# Patient Record
Sex: Female | Born: 1960 | Race: White | Hispanic: No | Marital: Married | Smoking: Never smoker
Health system: Southern US, Community
[De-identification: ages and names within clinical notes are randomized; demographics above are authoritative.]

## PROBLEM LIST (undated history)

## (undated) DIAGNOSIS — G039 Meningitis, unspecified: Secondary | ICD-10-CM

## (undated) DIAGNOSIS — M8580 Other specified disorders of bone density and structure, unspecified site: Secondary | ICD-10-CM

## (undated) DIAGNOSIS — N9489 Other specified conditions associated with female genital organs and menstrual cycle: Secondary | ICD-10-CM

## (undated) DIAGNOSIS — C8510 Unspecified B-cell lymphoma, unspecified site: Secondary | ICD-10-CM

## (undated) DIAGNOSIS — N838 Other noninflammatory disorders of ovary, fallopian tube and broad ligament: Secondary | ICD-10-CM

## (undated) DIAGNOSIS — I341 Nonrheumatic mitral (valve) prolapse: Secondary | ICD-10-CM

## (undated) DIAGNOSIS — Z9221 Personal history of antineoplastic chemotherapy: Secondary | ICD-10-CM

## (undated) DIAGNOSIS — R112 Nausea with vomiting, unspecified: Secondary | ICD-10-CM

## (undated) DIAGNOSIS — R748 Abnormal levels of other serum enzymes: Secondary | ICD-10-CM

## (undated) DIAGNOSIS — G96198 Other disorders of meninges, not elsewhere classified: Secondary | ICD-10-CM

## (undated) DIAGNOSIS — T451X5A Adverse effect of antineoplastic and immunosuppressive drugs, initial encounter: Secondary | ICD-10-CM

## (undated) DIAGNOSIS — C179 Malignant neoplasm of small intestine, unspecified: Secondary | ICD-10-CM

## (undated) DIAGNOSIS — K760 Fatty (change of) liver, not elsewhere classified: Secondary | ICD-10-CM

## (undated) HISTORY — DX: Other disorders of meninges, not elsewhere classified: G96.198

## (undated) HISTORY — DX: Nausea with vomiting, unspecified: R11.2

## (undated) HISTORY — DX: Malignant neoplasm of small intestine, unspecified: C17.9

## (undated) HISTORY — DX: Other specified disorders of bone density and structure, unspecified site: M85.80

## (undated) HISTORY — DX: Fatty (change of) liver, not elsewhere classified: K76.0

## (undated) HISTORY — PX: LAPAROSCOPY: SHX197

## (undated) HISTORY — DX: Other specified conditions associated with female genital organs and menstrual cycle: N94.89

## (undated) HISTORY — PX: DILATION AND CURETTAGE OF UTERUS: SHX78

## (undated) HISTORY — DX: Nonrheumatic mitral (valve) prolapse: I34.1

## (undated) HISTORY — DX: Meningitis, unspecified: G03.9

## (undated) HISTORY — DX: Other noninflammatory disorders of ovary, fallopian tube and broad ligament: N83.8

## (undated) HISTORY — DX: Abnormal levels of other serum enzymes: R74.8

## (undated) HISTORY — DX: Adverse effect of antineoplastic and immunosuppressive drugs, initial encounter: T45.1X5A

## (undated) HISTORY — DX: Personal history of antineoplastic chemotherapy: Z92.21

## (undated) HISTORY — DX: Unspecified B-cell lymphoma, unspecified site: C85.10

---

## 1998-08-21 ENCOUNTER — Other Ambulatory Visit: Admission: RE | Admit: 1998-08-21 | Discharge: 1998-08-21 | Payer: Self-pay | Admitting: Obstetrics and Gynecology

## 2000-04-07 ENCOUNTER — Other Ambulatory Visit: Admission: RE | Admit: 2000-04-07 | Discharge: 2000-04-07 | Payer: Self-pay | Admitting: Obstetrics and Gynecology

## 2001-05-28 ENCOUNTER — Other Ambulatory Visit: Admission: RE | Admit: 2001-05-28 | Discharge: 2001-05-28 | Payer: Self-pay | Admitting: Obstetrics and Gynecology

## 2001-06-10 ENCOUNTER — Encounter: Payer: Self-pay | Admitting: Obstetrics and Gynecology

## 2001-06-10 ENCOUNTER — Ambulatory Visit (HOSPITAL_COMMUNITY): Admission: RE | Admit: 2001-06-10 | Discharge: 2001-06-10 | Payer: Self-pay | Admitting: Obstetrics and Gynecology

## 2002-08-10 ENCOUNTER — Other Ambulatory Visit: Admission: RE | Admit: 2002-08-10 | Discharge: 2002-08-10 | Payer: Self-pay | Admitting: Obstetrics and Gynecology

## 2003-04-05 ENCOUNTER — Ambulatory Visit (HOSPITAL_COMMUNITY): Admission: RE | Admit: 2003-04-05 | Discharge: 2003-04-05 | Payer: Self-pay | Admitting: Obstetrics and Gynecology

## 2003-04-07 ENCOUNTER — Encounter: Admission: RE | Admit: 2003-04-07 | Discharge: 2003-04-07 | Payer: Self-pay | Admitting: Obstetrics and Gynecology

## 2003-11-07 ENCOUNTER — Other Ambulatory Visit: Admission: RE | Admit: 2003-11-07 | Discharge: 2003-11-07 | Payer: Self-pay | Admitting: Obstetrics and Gynecology

## 2003-11-15 ENCOUNTER — Encounter: Admission: RE | Admit: 2003-11-15 | Discharge: 2003-11-15 | Payer: Self-pay | Admitting: Obstetrics and Gynecology

## 2005-01-08 ENCOUNTER — Ambulatory Visit (HOSPITAL_COMMUNITY): Admission: RE | Admit: 2005-01-08 | Discharge: 2005-01-08 | Payer: Self-pay | Admitting: Obstetrics and Gynecology

## 2005-01-21 ENCOUNTER — Other Ambulatory Visit: Admission: RE | Admit: 2005-01-21 | Discharge: 2005-01-21 | Payer: Self-pay | Admitting: Obstetrics and Gynecology

## 2005-02-12 ENCOUNTER — Encounter: Admission: RE | Admit: 2005-02-12 | Discharge: 2005-02-12 | Payer: Self-pay | Admitting: Obstetrics and Gynecology

## 2006-01-28 ENCOUNTER — Encounter: Payer: Self-pay | Admitting: Family Medicine

## 2006-02-13 ENCOUNTER — Ambulatory Visit (HOSPITAL_COMMUNITY): Admission: RE | Admit: 2006-02-13 | Discharge: 2006-02-13 | Payer: Self-pay | Admitting: Obstetrics and Gynecology

## 2006-02-13 ENCOUNTER — Other Ambulatory Visit: Admission: RE | Admit: 2006-02-13 | Discharge: 2006-02-13 | Payer: Self-pay | Admitting: Obstetrics & Gynecology

## 2006-06-18 ENCOUNTER — Ambulatory Visit: Payer: Self-pay | Admitting: Orthopaedic Surgery

## 2006-12-17 ENCOUNTER — Ambulatory Visit: Payer: Self-pay

## 2006-12-30 ENCOUNTER — Other Ambulatory Visit: Admission: RE | Admit: 2006-12-30 | Discharge: 2006-12-30 | Payer: Self-pay | Admitting: Obstetrics and Gynecology

## 2007-06-21 ENCOUNTER — Ambulatory Visit: Payer: Self-pay | Admitting: Family Medicine

## 2007-06-21 DIAGNOSIS — M949 Disorder of cartilage, unspecified: Secondary | ICD-10-CM

## 2007-06-21 DIAGNOSIS — M899 Disorder of bone, unspecified: Secondary | ICD-10-CM | POA: Insufficient documentation

## 2007-06-21 DIAGNOSIS — Z8679 Personal history of other diseases of the circulatory system: Secondary | ICD-10-CM | POA: Insufficient documentation

## 2007-06-21 DIAGNOSIS — E559 Vitamin D deficiency, unspecified: Secondary | ICD-10-CM | POA: Insufficient documentation

## 2007-06-23 ENCOUNTER — Ambulatory Visit: Payer: Self-pay | Admitting: Family Medicine

## 2007-06-23 DIAGNOSIS — R5381 Other malaise: Secondary | ICD-10-CM

## 2007-06-23 DIAGNOSIS — R5383 Other fatigue: Secondary | ICD-10-CM

## 2007-06-25 LAB — CONVERTED CEMR LAB
ALT: 46 units/L — ABNORMAL HIGH (ref 0–35)
AST: 25 units/L (ref 0–37)
Alkaline Phosphatase: 67 units/L (ref 39–117)
Bilirubin, Direct: 0.2 mg/dL (ref 0.0–0.3)
CO2: 27 meq/L (ref 19–32)
Chloride: 105 meq/L (ref 96–112)
GFR calc non Af Amer: 81 mL/min
LDL Cholesterol: 78 mg/dL (ref 0–99)
Potassium: 4.1 meq/L (ref 3.5–5.1)
Sodium: 137 meq/L (ref 135–145)
TSH: 1.13 microintl units/mL (ref 0.35–5.50)
Total Bilirubin: 1.3 mg/dL — ABNORMAL HIGH (ref 0.3–1.2)
Total CHOL/HDL Ratio: 2.1
Triglycerides: 48 mg/dL (ref 0–149)

## 2008-02-01 ENCOUNTER — Other Ambulatory Visit: Admission: RE | Admit: 2008-02-01 | Discharge: 2008-02-01 | Payer: Self-pay | Admitting: Obstetrics and Gynecology

## 2008-06-29 ENCOUNTER — Ambulatory Visit: Payer: Self-pay | Admitting: Family Medicine

## 2008-06-29 DIAGNOSIS — M79609 Pain in unspecified limb: Secondary | ICD-10-CM

## 2008-06-29 DIAGNOSIS — I83893 Varicose veins of bilateral lower extremities with other complications: Secondary | ICD-10-CM | POA: Insufficient documentation

## 2008-08-16 ENCOUNTER — Ambulatory Visit: Payer: Self-pay | Admitting: Vascular Surgery

## 2008-11-22 ENCOUNTER — Ambulatory Visit: Payer: Self-pay | Admitting: Vascular Surgery

## 2009-03-02 ENCOUNTER — Ambulatory Visit (HOSPITAL_COMMUNITY): Admission: RE | Admit: 2009-03-02 | Discharge: 2009-03-02 | Payer: Self-pay | Admitting: Obstetrics and Gynecology

## 2009-03-21 ENCOUNTER — Ambulatory Visit: Payer: Self-pay | Admitting: Vascular Surgery

## 2009-03-28 ENCOUNTER — Ambulatory Visit: Payer: Self-pay | Admitting: Vascular Surgery

## 2009-03-30 ENCOUNTER — Ambulatory Visit: Payer: Self-pay | Admitting: Vascular Surgery

## 2009-04-11 ENCOUNTER — Ambulatory Visit: Payer: Self-pay | Admitting: Vascular Surgery

## 2009-04-18 ENCOUNTER — Ambulatory Visit: Payer: Self-pay | Admitting: Vascular Surgery

## 2009-06-20 ENCOUNTER — Ambulatory Visit: Payer: Self-pay | Admitting: Vascular Surgery

## 2009-06-27 ENCOUNTER — Ambulatory Visit: Payer: Self-pay | Admitting: Vascular Surgery

## 2010-03-06 ENCOUNTER — Telehealth: Payer: Self-pay | Admitting: Internal Medicine

## 2010-03-08 ENCOUNTER — Ambulatory Visit
Admission: RE | Admit: 2010-03-08 | Discharge: 2010-03-08 | Payer: Self-pay | Source: Home / Self Care | Attending: Family Medicine | Admitting: Family Medicine

## 2010-03-08 DIAGNOSIS — R42 Dizziness and giddiness: Secondary | ICD-10-CM | POA: Insufficient documentation

## 2010-03-13 ENCOUNTER — Encounter: Payer: Self-pay | Admitting: Family Medicine

## 2010-03-16 ENCOUNTER — Encounter: Payer: Self-pay | Admitting: Obstetrics and Gynecology

## 2010-03-21 ENCOUNTER — Other Ambulatory Visit: Payer: Self-pay | Admitting: Obstetrics and Gynecology

## 2010-03-21 ENCOUNTER — Ambulatory Visit (HOSPITAL_COMMUNITY): Admission: RE | Admit: 2010-03-21 | Payer: Self-pay | Source: Home / Self Care | Admitting: Obstetrics and Gynecology

## 2010-03-21 DIAGNOSIS — Z139 Encounter for screening, unspecified: Secondary | ICD-10-CM

## 2010-03-21 DIAGNOSIS — Z1231 Encounter for screening mammogram for malignant neoplasm of breast: Secondary | ICD-10-CM

## 2010-03-27 ENCOUNTER — Encounter: Payer: Self-pay | Admitting: Obstetrics and Gynecology

## 2010-03-28 NOTE — Progress Notes (Signed)
Summary: patient canc appt  Phone Note Call from Patient   Caller: Patient Call For: Kerby Nora MD Summary of Call: Patient called earlier, saying that she was feeling dizzy, "eyes were going crazy", and was feeling nauseated. I spoke with Dr. Para March, he suggested UC, then Dr. Alphonsus Sias said that if patient felt comftorable enough to wait until tomorrow that he would see her at 1:30. Patient was okay with that idea because she said that she had already started to feel better, appt was scheduled. Then patient called back this afternoon saying that she was feeling much better now and wanted to just follow up with Dr. Ermalene Searing on Friday because tomorrow is going to be a hectic day for her at work. Appt scheduled with Dr. Ermalene Searing for Friday.  Initial call taken by: Melody Comas,  March 06, 2010 4:48 PM  Follow-up for Phone Call        That is fine Follow-up by: Cindee Salt MD,  March 06, 2010 6:53 PM

## 2010-03-28 NOTE — Assessment & Plan Note (Signed)
Summary: dizzy, nauseated/alc   Vital Signs:  Patient profile:   50 year old female Height:      61.25 inches Weight:      155.50 pounds BMI:     29.25 Temp:     98.3 degrees F oral Pulse rate:   72 / minute Pulse rhythm:   regular BP sitting:   110 / 80  (left arm) Cuff size:   regular  Vitals Entered By: Benny Lennert CMA Duncan Dull) (March 08, 2010 10:50 AM)  History of Present Illness: Chief complaint dizzy,nauseated and ? seizure on wednesday   Was feeling fine doing well until 2 days ago. When sitting at computer..couldn't focus, room spinning fast eyes were moving rapidly (nystagmus).. felt pale and hot. Lasted several minutes. (Was concious the whole time, talking, thinking clearly)  She had been trying out new contacts.. but eye MDs do not think that this is cause.  Since episode...she has been nauseous and intermittant lightheadedness when driving..no real change when moving head.  No vision change. No blurred vision.  No tnnitus, no hearing loss.   No weakness, no numbness. No rhythmic movements  BP at home was nml.  No hisotry of high cholesterol, nonsmoker.No family history of CVA.  Problems Prior to Update: 1)  Leg Pain, Bilateral  (ICD-729.5) 2)  Varicose Veins Lower Extremities W/oth Comps  (ICD-454.8) 3)  Other Malaise and Fatigue  (ICD-780.79) 4)  Screening For Lipoid Disorders  (ICD-V77.91) 5)  Osteopenia  (ICD-733.90) 6)  Unspecified Vitamin D Deficiency  (ICD-268.9) 7)  Family History of Cad Female 1st Degree Relative <50  (ICD-V17.3) 8)  Arrhythmia, Hx of  (ICD-V12.50)  Current Medications (verified): 1)  Multivitamins   Tabs (Multiple Vitamin) .... Take 1 Tablet By Mouth Once A Day  Allergies (verified): No Known Drug Allergies  Past History:  Past medical, surgical, family and social histories (including risk factors) reviewed, and no changes noted (except as noted below).  Past Medical History: Reviewed history from 06/29/2008 and no  changes required. neg  Past Surgical History: Reviewed history from 06/21/2007 and no changes required. stress fracture femoral neck 2/08 Tonsillectomy exp lap 1970s negative  Family History: Reviewed history from 06/21/2007 and no changes required. father: lung cancer, prostate Cancer mother: high cholesterol brother: MI age 43, then 3 since,  Family History of CAD Female 1st degree relative <50 MGM: DM, heart prob  Social History: Reviewed history from 06/21/2007 and no changes required. Occupation: Advertising account executive Married 2 children: healthy Never Smoked Alcohol use-yes, champagne 2-3 on weekends Regular exercise-no, usually does except  Diet: fast food  Review of Systems General:  Denies fatigue and fever. CV:  Denies chest pain or discomfort. Resp:  Denies shortness of breath. GI:  Denies abdominal pain. GU:  Denies dysuria.  Physical Exam  General:  Well-developed,well-nourished,in no acute distress; alert,appropriate and cooperative throughout examination Eyes:  No corneal or conjunctival inflammation noted. EOMI. Perrla. Funduscopic exam benign, without hemorrhages, exudates or papilledema. Vision grossly normal. Ears:  External ear exam shows no significant lesions or deformities.  Otoscopic examination reveals clear canals, tympanic membranes are intact bilaterally without bulging, retraction, inflammation or discharge. Hearing is grossly normal bilaterally. Nose:  External nasal examination shows no deformity or inflammation. Nasal mucosa are pink and moist without lesions or exudates. Mouth:  Oral mucosa and oropharynx without lesions or exudates.  Teeth in good repair. Neck:  no carotid bruit or thyromegaly no cervical or supraclavicular lymphadenopathy  Lungs:  Normal respiratory effort, chest  expands symmetrically. Lungs are clear to auscultation, no crackles or wheezes. Heart:  Normal rate and regular rhythm. S1 and S2 normal without gallop, murmur, click, rub or  other extra sounds. Pulses:  Diffuse varicose veins in LE, DP and PT 2+ Extremities:  No clubbing, cyanosis, edema, or deformity noted with normal full range of motion of all joints.   Neurologic:  No cranial nerve deficits noted. Station and gait are normal. Plantar reflexes are down-going bilaterally. DTRs are symmetrical throughout. Sensory, motor and coordinative functions appear intact. finger-to-nose normal.   Psych:  Cognition and judgment appear intact. Alert and cooperative with normal attention span and concentration. No apparent delusions, illusions, hallucinations   Impression & Recommendations:  Problem # 1:  VERTIGO (ICD-780.4) Low risk for stroke..no risk factors.. but TIA remains on differential. Nml neuro exam.  No clear symptoms of acoustic neuroma or Meniere's.  Most likely inner ear although .. unable to produce symptoms with movement in office.  Will refer for further eval at ENT given unclear diagnosis.  Orders: ENT Referral (ENT)  Complete Medication List: 1)  Multivitamins Tabs (Multiple vitamin) .... Take 1 tablet by mouth once a day  Patient Instructions: 1)  Referral Appointment Information 2)  Day/Date: 3)  Time: 4)  Place/MD: 5)  Address: 6)  Phone/Fax: 7)  Patient given appointment information. Information/Orders faxed/mailed.    Orders Added: 1)  Est. Patient Level III [16109] 2)  ENT Referral [ENT]    Current Allergies (reviewed today): No known allergies   Flu Vaccine Next Due:  Refused Flex Sig Next Due:  Not Indicated Last PAP:  Normal (01/25/2007 9:05:32 AM) PAP Result Date:  01/24/2009 PAP Result:  normal PAP Next Due:  1 yr Last Mammogram:  Normal Bilateral (01/25/2007 9:07:02 AM) Mammogram Result Date:  01/24/2009 Mammogram Result:  normal Mammogram Next Due:  1 yr

## 2010-04-03 NOTE — Letter (Signed)
Summary: [facility]  [facility]   Imported By: Kassie Mends 03/29/2010 09:37:36  _____________________________________________________________________  External Attachment:    Type:   Image     Comment:   External Document

## 2010-04-15 ENCOUNTER — Encounter (HOSPITAL_COMMUNITY): Payer: Self-pay

## 2010-04-15 ENCOUNTER — Ambulatory Visit (HOSPITAL_COMMUNITY)
Admission: RE | Admit: 2010-04-15 | Discharge: 2010-04-15 | Disposition: A | Discharge status: Home / Self Care | Payer: BC Managed Care – PPO | Source: Ambulatory Visit | Attending: Obstetrics and Gynecology | Admitting: Obstetrics and Gynecology

## 2010-04-15 DIAGNOSIS — Z1231 Encounter for screening mammogram for malignant neoplasm of breast: Secondary | ICD-10-CM | POA: Insufficient documentation

## 2010-05-20 ENCOUNTER — Other Ambulatory Visit (HOSPITAL_COMMUNITY): Payer: Self-pay | Admitting: Urology

## 2010-05-20 DIAGNOSIS — N361 Urethral diverticulum: Secondary | ICD-10-CM

## 2010-05-26 ENCOUNTER — Ambulatory Visit (HOSPITAL_COMMUNITY)
Admission: RE | Admit: 2010-05-26 | Discharge: 2010-05-26 | Disposition: A | Discharge status: Home / Self Care | Payer: BC Managed Care – PPO | Source: Ambulatory Visit | Attending: Urology | Admitting: Urology

## 2010-05-26 DIAGNOSIS — Q528 Other specified congenital malformations of female genitalia: Secondary | ICD-10-CM | POA: Insufficient documentation

## 2010-05-26 DIAGNOSIS — D259 Leiomyoma of uterus, unspecified: Secondary | ICD-10-CM | POA: Insufficient documentation

## 2010-05-26 DIAGNOSIS — N2889 Other specified disorders of kidney and ureter: Secondary | ICD-10-CM | POA: Insufficient documentation

## 2010-05-26 DIAGNOSIS — N361 Urethral diverticulum: Secondary | ICD-10-CM

## 2010-05-26 DIAGNOSIS — N72 Inflammatory disease of cervix uteri: Secondary | ICD-10-CM | POA: Insufficient documentation

## 2010-05-26 DIAGNOSIS — M259 Joint disorder, unspecified: Secondary | ICD-10-CM | POA: Insufficient documentation

## 2010-05-26 DIAGNOSIS — Q516 Embryonic cyst of cervix: Secondary | ICD-10-CM | POA: Insufficient documentation

## 2010-07-09 NOTE — Assessment & Plan Note (Signed)
OFFICE VISIT   Peggy, Mcfarland  DOB:  1960-11-12                                       03/21/2009  [id number]#:[id number]   The patient presents today for laser ablation of her right great  saphenous vein for severe venous hypertension related to reflux in her  great saphenous vein.  She underwent uneventful laser ablation from her  right knee to her saphenofemoral junction with no immediate  complications.  She will be seen again in 1 week with repeat duplex  followup.  She does have a similar severe discomfort in her left leg and  will have a staged left laser ablation following recovery.     Larina Earthly, M.D.  Electronically Signed   TFE/MEDQ  D:  03/21/2009  T:  03/22/2009  Job:  [id number]

## 2010-07-09 NOTE — Procedures (Signed)
DUPLEX DEEP VENOUS EXAM - LOWER EXTREMITY   INDICATION:  Followup left greater saphenous vein laser ablation.   HISTORY:  Edema:  No.  Trauma/Surgery:  Left greater saphenous vein laser ablation on  04/11/2009, right greater saphenous vein laser ablation on 03/21/2009.  Pain:  Yes.  PE:  No.  Previous DVT:  No.  Anticoagulants:  No.  Other:   DUPLEX EXAM:                CFV   SFV   PopV  PTV    GSV                R  L  R  L  R  L  R   L  R  L  Thrombosis    o  o     o     o      o     +  Spontaneous   +  +     +     +      +     o  Phasic        +  +     +     +      +     o  Augmentation  +  +     +     +      +     o  Compressible  +  +     +     +      +     o  Competent     o  o     +     +      +     o   Legend:  + - yes  o - no  p - partial  D - decreased   IMPRESSION:  1. No evidence of deep vein thrombosis noted in the left lower      extremity.  2. 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  3. Mild reflux is noted at the bilateral common femoral vein levels.  4. No reflux is noted in the left saphenofemoral junction.    _____________________________  Larina Earthly, M.D.   CH/MEDQ  D:  04/19/2009  T:  04/19/2009  Job:  [id number]

## 2010-07-09 NOTE — Assessment & Plan Note (Signed)
OFFICE VISIT   Peggy Mcfarland, MINICHIELLO  DOB:  Oct 14, 1960                                       04/18/2009  [id number]#:[id number]   The patient presents today for followup of her left leg great saphenous  vein laser ablation on 04/11/2009.  She did well.  She does report the  usual amount of discomfort over her medial thigh.  She underwent  noninvasive vascular laboratory studies in our office today.  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  She does  have mild bruising over her medial thigh and typical erythema present as  well.   I am quite pleased with her early results.  She did have laser ablation  of her right great saphenous vein with failure of closure.  This was on  03/21/2009.  She has continued to have discomfort with prolonged  standing, symptoms equal to what she had before the attempted ablation.  She does wish to proceed with ablation of her right great saphenous  vein.  She has business and social obligations which would not make this  possible until May or June.  She continues to have no relief from  compression garments.  We will schedule this at her convenience.     Larina Earthly, M.D.  Electronically Signed   TFE/MEDQ  D:  04/18/2009  T:  04/19/2009  Job:  [id number]

## 2010-07-09 NOTE — Procedures (Signed)
DUPLEX DEEP VENOUS EXAM - LOWER EXTREMITY   INDICATION:  Follow up right greater saphenous vein ablation.   HISTORY:  Edema:  No.  Trauma/Surgery:  Right greater saphenous vein ablation.  Pain:  No.  PE:  No.  Previous DVT:  No.  Anticoagulants:  No.  Other:  No.   DUPLEX EXAM:                CFV   SFV   PopV  PTV    GSV                R  L  R  L  R  L  R   L  R  L  Thrombosis    o  o  o     o     o      +  Spontaneous   +  +  +     +     +      0  Phasic        +  +  +     +     +      0  Augmentation  +  +  +     +     +      0  Compressible  +  +  +     +     +      0  Competent     d  d  +     +     +      0   Legend:  + - yes  o - no  p - partial  D - decreased   IMPRESSION:  There does not appear to be any evidence of deep venous  thrombus in the right leg.  The right greater saphenous veins appear  ablated proximally to the knee level.  There is very mild reflux noted  in bilateral common femoral veins.    _____________________________  Larina Earthly, M.D.   CB/MEDQ  D:  06/27/2009  T:  06/27/2009  Job:  [id number]

## 2010-07-09 NOTE — Assessment & Plan Note (Signed)
OFFICE VISIT   TOMI, GRANDPRE  DOB:  03/30/1960                                       03/28/2009  [id number]#:[id number]   The patient presents today for 1 week followup of her laser ablation of  her right great saphenous vein.  She had no complications immediately  following her procedure with mild discomfort and bruising.  Her area of  the IV insertion is without erythema.  She does have less swelling in  her ankle.   She underwent a repeat duplex in our office and this shows complete  failure of closure of her saphenous vein.  She has a very small area of  focal partial thrombosed area of her vein in the mid thigh otherwise no  evidence of any injury to the vein.   I discussed this at length with the patient explaining that this is very  unusual to see failure of ablation.  I explained that the most common  cause of seeing this potentially would be aneurysmal vein and she  certainly is not in this category.  I explained the options for the  patient.  I explained that we could repeat her ablation or we could  proceed with surgical removal of her saphenous vein.  I explained that  on the rare cases where we have seen failure that we have had very good  success and recurrent treatment with closure of the vein and this is  what I would recommend.  She is scheduled to have her left leg ablated  on February 16 and she wishes to proceed and keep this appointment as  planned.  We will discuss further treatment of her right leg at that  time.     Larina Earthly, M.D.  Electronically Signed   TFE/MEDQ  D:  03/28/2009  T:  03/29/2009  Job:  [id number]

## 2010-07-09 NOTE — Assessment & Plan Note (Signed)
OFFICE VISIT   Peggy Mcfarland, Peggy Mcfarland  DOB:  11-25-1960                                       06/20/2009  [id number]#:[id number]   The patient presents for treatment today of her right great saphenous  vein for symptomatic venous hypertension.  She had undergone ablation of  her right great saphenous vein with failure of closure.  She, since  then, underwent treatment of her left vein with good result.  She is  here today for reclosure of her right great saphenous vein.  I did  ultrasound image of her left great saphenous vein during the procedure  and this was completely occluded.  She underwent ablation of her right  great saphenous vein from the knee to just below the saphenofemoral  junction with no immediate complications.  She will be seen again in 1  week for followup and instructed on her postoperative care.     Larina Earthly, M.D.  Electronically Signed   TFE/MEDQ  D:  06/20/2009  T:  06/21/2009  Job:  [id number]

## 2010-07-09 NOTE — Consult Note (Signed)
NEW PATIENT CONSULTATION   Peggy Mcfarland, Peggy Mcfarland  DOB:  Feb 10, 1961                                       08/16/2008  [id number]#:[id number]   The patient presents today for evaluation of lower extremity discomfort.  She is a very pleasant, healthy 50 year old white female with  progressive difficulty in both lower extremities.  She notes progressive  swelling and aching in her legs from her calves distally with prolonged  standing.  She reports that there is a throbbing sensation that occurs  late in the day.  On initial rising from bed she does not have this and  does not have any swelling on initial rising.  She does have some  scattered tributary varicosities at the level of her ankle but no other  large varicose veins.  She does not have any history of bleeding and  does not have any history of superficial thrombophlebitis or deep venous  thrombosis.  She elevates her legs when possible and does take Advil for  discomfort.  She has worn some nonprescription support hose in the past  with mild relief.   PAST MEDICAL HISTORY:  Her past history is negative for any major  medical difficulty.  She denies any cardiac, pulmonary or other major  medical difficulties.   MEDICATIONS:  She is on vitamin D and multivitamin but no other  medications.   ALLERGIES:  She has no known drug allergies.   SOCIAL HISTORY:  She is married with two children.  She works as a IT trainer.  She does not smoke.  She has social alcohol consumption twice a week.   REVIEW OF SYSTEMS:  She does report some weight gain.  Her weight is 150  pounds.  She is 5 feet tall.  She does have occasional palpitations and  a heart murmur.  She denies other pulmonary, GI, GU or other  difficulties.   PHYSICAL EXAMINATION:  A well-developed, well-nourished white female  appearing stated age of 70.  Her blood pressure is 122/79, pulse 70 and  respirations 18.  Her radial pulses are 2+.  She has 2+ dorsalis pedis  pulses bilaterally.  She does have changes consistent with venous  hypertension at the level of her ankles bilaterally with spider vein  telangiectasia and venous blushes in this area.  She does have some  small tributary varicosities in her calves.   She underwent noninvasive vascular laboratory studies in our office and  this reveals gross reflux throughout her great saphenous vein  bilaterally.  She does not have any reflux in the small saphenous vein.  Fortunately she does not have any significant reflux in her deep system  with mild reflux only in the right common femoral vein.   I discussed this at length with the patient.  I explained that her  symptoms do appear to be related to venous hypertension related to her  superficial valvular incompetence.  We have fitted her today for knee  high graduated compression garments and have instructed her on the use  of these.  I plan to see her in 3 months to determine if she is having  acceptable outcome from conservative treatment.  I then discussed the  options of laser ablation for relief of her venous hypertension should  she fail conservative treatment.  We will see her again in 3 months for  continued  discussion.   Larina Earthly, M.D.  Electronically Signed   TFE/MEDQ  D:  08/16/2008  T:  08/17/2008  Job:  [id number]   cc:   Juleen China, MD

## 2010-07-09 NOTE — Assessment & Plan Note (Signed)
OFFICE VISIT   CATALIA, MASSETT  DOB:  08-Aug-1960                                       06/27/2009  JWJXB#:[id number]   The patient presents today for follow-up of her right leg laser ablation  of her great saphenous vein.  This was 1 week ago.  She had had a prior  successful left leg ablation and had prior right leg ablation with  nonclosure.  She has the usual amount of discomfort over her medial  thigh and this is resolving.  She has mild erythema.  She has a palpable  dorsalis pedis pulse.  Her venous duplex today shows no evidence of DVT  and closure of her right great saphenous vein from her knee to just  below the saphenofemoral junction.  I am quite pleased with the result.  She will see [place] again on an as-needed basis.     Larina Earthly, M.D.  Electronically Signed   TFE/MEDQ  D:  06/27/2009  T:  06/28/2009  Job:  [id number]

## 2010-07-09 NOTE — Procedures (Signed)
DUPLEX DEEP VENOUS EXAM - LOWER EXTREMITY   INDICATION:  Followup right greater saphenous vein ablation.   HISTORY:  Edema:  no  Trauma/Surgery:  Right greater saphenous vein ablation 03/21/2009 by Dr.  Arbie Cookey  Pain:  no  PE:  no  Previous DVT:  no  Anticoagulants:  no  Other:   DUPLEX EXAM:                CFV   SFV    PopV  PTV     GSV                R  L  R   L  R  L  R    L  R    L  Thrombosis    0  0  0      0     0       +  Spontaneous   +  +  +      +     +       +  Phasic        +  +  +      +     +       +  Augmentation  +  +  +      +     +       +  Compressible  +  +  +      +     +       P  Competent     0  0  D      D     +       0   Legend:  + - yes  o - no  p - partial  D - decreased   IMPRESSION:  1. No evidence of deep venous thrombosis in the right lower extremity      or left common femoral vein.  2. Status post right greater saphenous vein ablation, there is focal      partially occluding thrombus in the distal thigh.  The rest of the      greater saphenous vein appears patent with reflux.  3. Deep venous insufficiency noted.           _____________________________  Larina Earthly, M.D.   AS/MEDQ  D:  03/28/2009  T:  03/28/2009  Job:  [id number]

## 2010-07-09 NOTE — Procedures (Signed)
LOWER EXTREMITY VENOUS REFLUX EXAM   INDICATION:  Bilateral lower extremity varicose veins.   EXAM:  Using color-flow imaging and pulse Doppler spectral analysis, the  right and left common femoral, superficial femoral, popliteal, posterior  tibial, greater and lesser saphenous veins are evaluated.  There is mild  evidence suggesting deep venous insufficiency in the right lower  extremity.   The right and left saphenofemoral junction is not competent with reflux  of >500 milliseconds.  The right and left GSV is not competent with  reflux of >500 milliseconds with the caliber as described below.   The right and left proximal short saphenous vein demonstrates  competency.   GSV Diameter (used if found to be incompetent only)                                            Right    Left  Proximal Greater Saphenous Vein           0.47 cm  0.51 cm  Proximal-to-mid-thigh                     0.51 cm  0.39 cm  Mid thigh                                 0.45 cm  0.33 cm  Mid-distal thigh                          1.02 cm  1.22 cm  Distal thigh                              0.41 cm  0.53 cm  Knee                                      0.45 cm  0.46 cm   IMPRESSION:  1. The right and left greater saphenous vein reflux with >500      milliseconds is identified with the caliber as described above from      knee to groin.  2. The right and left greater saphenous veins are not aneurysmal.  3. The right and left greater saphenous veins are not tortuous.  4. The right deep venous system is not competent with reflux of >500      milliseconds.  5. The right and left lesser saphenous veins are competent with reflux      of >500 milliseconds.  6. There is no evidence of deep venous thrombosis.         ___________________________________________  Larina Earthly, M.D.   AC/MEDQ  D:  08/16/2008  T:  08/16/2008  Job:  [id number]

## 2010-07-09 NOTE — Assessment & Plan Note (Signed)
OFFICE VISIT   MAKENLEY, SHIMP  DOB:  09-06-1960                                       03/30/2009  [id number]#:[id number]   The patient presents today for continued follow-up of her right leg  venous pathology.  She had been seen on 02/02 in a 1 week follow-up of  right leg laser ablation.  She had had the finding of unsuccessful  ablation with a patent saphenous vein.  She reported later that evening  that she began having pain in her calf and also on her thigh and had  notified our office of this yesterday.  I had asked that we see her  today to rule out any new difficulties.   On physical examination, she does have superficial phlebitis in a small  area of the saphenous vein below her knee and otherwise no changes.   I did image these areas with SonoSite ultrasound and this does show that  she has a superficial thrombus over a small segment in the mid calf but  no evidence of thrombus in the remaining saphenous vein.  I discussed  this at length with the patient.  I am at a loss to explain why she  would have developed clot below an unsuccessful ablation of her vein.  Nonetheless, I explained that this will resolve with heat and anti-  inflammatory.  We scheduled her for left leg laser ablation on 02/16 and  will proceed with this as planned.     Larina Earthly, M.D.  Electronically Signed   TFE/MEDQ  D:  03/30/2009  T:  04/02/2009  Job:  [id number]

## 2010-07-09 NOTE — Assessment & Plan Note (Signed)
OFFICE VISIT   Peggy Mcfarland, Peggy Mcfarland  DOB:  April 24, 1960                                       04/11/2009  EAVWU#:[id number]   The patient underwent uneventful laser ablation of her left great  saphenous vein from her proximal calf to just below the saphenofemoral  junction.  She had no immediate complication and will be seen again in 1  week with repeat duplex followup.  I did image her right great saphenous  vein with the SonoSite during the procedure and this did show continued  patency of her right great saphenous vein.     Larina Earthly, M.D.  Electronically Signed   TFE/MEDQ  D:  04/11/2009  T:  04/12/2009  Job:  [id number]

## 2010-07-09 NOTE — Assessment & Plan Note (Signed)
OFFICE VISIT   LLEWELLYN, SCHOENBERGER  DOB:  16-Jun-1960                                       11/22/2008  EAVWU#:[id number]   Patient presents today for continued follow-up of her bilateral venous  hypertension.  She reports that she has had no improvement with  compression garments.  She does continue to elevate her legs and use  ibuprofen for discomfort.  She does have significant pain at the end of  the day.  She also has swelling around her distal calves and ankles.  She has had to stop playing tennis due to discomfort in her lower  extremities and also has sleep disruption related to this as well.  It  does interfere with her working activities as well.   PHYSICAL EXAMINATION:  Unchanged. She does have a have a few scattered  telangiectasia but no large varicosities.   Her venous duplex does reveal reflux throughout her great saphenous vein  bilaterally with no significant deep venous reflux.   I have recommended that we proceed with staged bilateral laser ablation  of her great saphenous vein for reduction of her venous hypertension.  We discussed the procedure, and she wished to proceed with her right leg  initially and then follow up with staged left leg ablation.  She  understands the procedure, including potential risk.  We will schedule  this at her earliest convenience.   Larina Earthly, M.D.  Electronically Signed   TFE/MEDQ  D:  11/22/2008  T:  11/23/2008  Job:  [id number]   cc:   Juleen China, MD

## 2011-02-25 DIAGNOSIS — R748 Abnormal levels of other serum enzymes: Secondary | ICD-10-CM

## 2011-02-25 HISTORY — DX: Abnormal levels of other serum enzymes: R74.8

## 2012-10-11 ENCOUNTER — Encounter: Payer: Self-pay | Admitting: Obstetrics and Gynecology

## 2012-10-11 ENCOUNTER — Other Ambulatory Visit: Payer: Self-pay | Admitting: Obstetrics and Gynecology

## 2012-10-11 DIAGNOSIS — Z1231 Encounter for screening mammogram for malignant neoplasm of breast: Secondary | ICD-10-CM

## 2012-10-12 ENCOUNTER — Encounter: Payer: Self-pay | Admitting: Obstetrics and Gynecology

## 2012-10-12 ENCOUNTER — Ambulatory Visit (HOSPITAL_COMMUNITY)
Admission: RE | Admit: 2012-10-12 | Discharge: 2012-10-12 | Disposition: A | Discharge status: Home / Self Care | Payer: BC Managed Care – PPO | Source: Ambulatory Visit | Attending: Obstetrics and Gynecology | Admitting: Obstetrics and Gynecology

## 2012-10-12 ENCOUNTER — Ambulatory Visit (INDEPENDENT_AMBULATORY_CARE_PROVIDER_SITE_OTHER): Payer: BC Managed Care – PPO | Admitting: Obstetrics and Gynecology

## 2012-10-12 VITALS — BP 126/84 | HR 76 | Resp 18 | Ht 61.0 in | Wt 170.0 lb

## 2012-10-12 DIAGNOSIS — Z1231 Encounter for screening mammogram for malignant neoplasm of breast: Secondary | ICD-10-CM

## 2012-10-12 DIAGNOSIS — Z01419 Encounter for gynecological examination (general) (routine) without abnormal findings: Secondary | ICD-10-CM

## 2012-10-12 DIAGNOSIS — Z0000 Encounter for general adult medical examination without abnormal findings: Secondary | ICD-10-CM

## 2012-10-12 LAB — POCT URINALYSIS DIPSTICK
Glucose, UA: NEGATIVE
Ketones, UA: NEGATIVE
Leukocytes, UA: NEGATIVE
Protein, UA: NEGATIVE
Urobilinogen, UA: NEGATIVE

## 2012-10-12 NOTE — Progress Notes (Signed)
52 y.o.   Married    Caucasian   female   G2P2   here for annual exam.  C/o vag dryness and pain with sex.  Uses a lubricant.  Discussed option for vag E2, but she declines at this point.  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  I referred her to Dr. McDiarmid, and he also thought that's what it was.  On MRI, however, it had no connection to the urethra, and was felt to be totally within the vagina.  She had no further w/u and has had no problems from it.    Patient's last menstrual period was 02/24/2010.          Sexually active: yes  The current method of family planning is vasectomy and post menopausal status.    Exercising: No Last mammogram:  03/2010 neg, appt today 10/12/12 Last pap smear:02/01/08 neg History of abnormal pap: no Smoking: never Alcohol: occ alcohol Last colonoscopy:appt 11/2012 Last Bone Density:  2008 osteopenia Last tetanus shot:2004 Last cholesterol check: 08/2012 @ [facility] Normal  Hgb: @[facility]            Urine: neg   Family History  Problem Relation Age of Onset  . Cancer Father     LUNG CANCER  . Heart attack Brother   . Heart disease Brother     Patient Active Problem List   Diagnosis Date Noted  . VERTIGO 03/08/2010  . VARICOSE VEINS LOWER EXTREMITIES W/OTH COMPS 06/29/2008  . LEG PAIN, BILATERAL 06/29/2008  . OTHER MALAISE AND FATIGUE 06/23/2007  . UNSPECIFIED VITAMIN D DEFICIENCY 06/21/2007  . OSTEOPENIA 06/21/2007  . ARRHYTHMIA, HX OF 06/21/2007    Past Medical History  Diagnosis Date  . MVP (mitral valve prolapse)     req Prophylaxis  . Osteopenia     Hip Fracture Age   . Elevated liver enzymes 2013    Past Surgical History  Procedure Laterality Date  . Dilation and curettage of uterus    . Laparoscopy      D&C    Allergies: Review of patient's allergies indicates no known allergies.  Current Outpatient Prescriptions  Medication Sig Dispense Refill  . CALCIUM  PO Take by mouth once a week. CHEWABLE      . Cholecalciferol (VITAMIN D3) 1000 UNITS CAPS Take by mouth 2 (two) times daily.      . Multiple Vitamin (MULTI-VITAMIN DAILY PO) Take by mouth.       No current facility-administered medications for this visit.    ROS: Pertinent items are noted in HPI.  Social Hx: Married, two children, is a Surveyor, mining, moved to [place] in 2012, but has now moved back.    Exam:    BP 126/84  Pulse 76  Resp 18  Ht 5\' 1"  (1.549 m)  Wt 170 lb (77.111 kg)  BMI 32.14 kg/m2  LMP 01/01/2012Ht stable and wt up 11 pounds from Feb 2012   Wt Readings from Last 3 Encounters:  10/12/12 170 lb (77.111 kg)  03/08/10 155 lb 8 oz (70.534 kg)  06/29/08 152 lb (68.947 kg)     Ht Readings from Last 3 Encounters:  10/12/12 5\' 1"  (1.549 m)  03/08/10 5' 1.25" (1.556 m)  06/29/08 5' 1.25" (1.556 m)    General appearance: alert, cooperative and appears stated age Head: Normocephalic, without obvious abnormality, atraumatic Neck: no adenopathy, supple, symmetrical, trachea midline and  thyroid not enlarged, symmetric, no tenderness/mass/nodules Lungs: clear to auscultation bilaterally Breasts: Inspection negative, No nipple retraction or dimpling, No nipple discharge or bleeding, No axillary or supraclavicular adenopathy, Normal to palpation without dominant masses Heart: regular rate and rhythm Abdomen: soft, non-tender; bowel sounds normal; no masses,  no organomegaly Extremities: extremities normal, atraumatic, no cyanosis or edema Skin: Skin color, texture, turgor normal. No rashes or lesions Lymph nodes: Cervical, supraclavicular, and axillary nodes normal. No abnormal inguinal nodes palpated Neurologic: Grossly normal   Pelvic: External genitalia:  no lesions              Urethra:  normal appearing urethra with no masses, tenderness or lesions              Bartholins and Skenes: normal                 Vagina: normal appearing vagina with normal  color and discharge,upon withdrawing the speculum, the previously noted vaginal cyst anteriorly in the proximal third of the vagina is visualized, size is unchanged from previous exam, about 4-5 cm.                Cervix: normal appearance              Pap taken: yes        Bimanual Exam:  Uterus:  uterus is normal size, shape, consistency and nontender                                      Adnexa: normal adnexa in size, nontender and no masses                                      Rectovaginal: Confirms                                      Anus:  normal sphincter tone, no lesions  A: normal menopausal exam, no HRT     Osteopenia;  Hip fracture at age 38     4-5 cm anterior vag wall cyst, asymptomatic     P:     mammogram pap smear counseled on breast self exam, mammography screening, adequate intake of calcium and vitamin D, diet and exercise return annually or prn     An After Visit Summary was printed and given to the patient.

## 2012-10-12 NOTE — Patient Instructions (Signed)

## 2012-10-14 LAB — IPS PAP TEST WITH HPV

## 2013-01-24 HISTORY — PX: COLONOSCOPY W/ BIOPSIES: SHX1374

## 2013-09-24 HISTORY — PX: BOWEL RESECTION: SHX1257

## 2013-09-26 ENCOUNTER — Other Ambulatory Visit: Payer: Self-pay | Admitting: Obstetrics and Gynecology

## 2013-09-26 DIAGNOSIS — Z1231 Encounter for screening mammogram for malignant neoplasm of breast: Secondary | ICD-10-CM

## 2013-10-14 ENCOUNTER — Ambulatory Visit (HOSPITAL_COMMUNITY)
Admission: RE | Admit: 2013-10-14 | Discharge: 2013-10-14 | Disposition: A | Discharge status: Home / Self Care | Payer: BC Managed Care – PPO | Source: Ambulatory Visit | Attending: Obstetrics and Gynecology | Admitting: Obstetrics and Gynecology

## 2013-10-14 ENCOUNTER — Encounter: Payer: Self-pay | Admitting: Obstetrics and Gynecology

## 2013-10-14 ENCOUNTER — Ambulatory Visit (INDEPENDENT_AMBULATORY_CARE_PROVIDER_SITE_OTHER): Payer: BC Managed Care – PPO | Admitting: Obstetrics and Gynecology

## 2013-10-14 VITALS — BP 120/82 | HR 80 | Resp 16 | Ht 61.0 in | Wt 153.6 lb

## 2013-10-14 DIAGNOSIS — R8281 Pyuria: Secondary | ICD-10-CM

## 2013-10-14 DIAGNOSIS — Z1231 Encounter for screening mammogram for malignant neoplasm of breast: Secondary | ICD-10-CM

## 2013-10-14 DIAGNOSIS — Z0000 Encounter for general adult medical examination without abnormal findings: Secondary | ICD-10-CM

## 2013-10-14 DIAGNOSIS — R82998 Other abnormal findings in urine: Secondary | ICD-10-CM

## 2013-10-14 DIAGNOSIS — Z01419 Encounter for gynecological examination (general) (routine) without abnormal findings: Secondary | ICD-10-CM

## 2013-10-14 DIAGNOSIS — N841 Polyp of cervix uteri: Secondary | ICD-10-CM

## 2013-10-14 LAB — POCT URINALYSIS DIPSTICK
Bilirubin, UA: NEGATIVE
Blood, UA: NEGATIVE
Glucose, UA: NEGATIVE
Nitrite, UA: NEGATIVE
PROTEIN UA: NEGATIVE
Urobilinogen, UA: NEGATIVE
pH, UA: 5

## 2013-10-14 NOTE — Progress Notes (Signed)
Patient ID: Peggy Mcfarland, female   DOB: 1960/10/28, 53 y.o.   MRN: [id number] GYNECOLOGY VISIT  PCP: Rutherford Nail, MD [facility])  Referring provider:   HPI: 53 y.o.   Married  Caucasian  female   G2P2 with Patient's last menstrual period was 02/24/2010.   here for   AEX. Not hot flashes. No HRT.  No bleeding.   Lost 20 pounds and 29 inches since July 2015.  Did the HCG program.  No longer doing HCG treatment.  Walking.   Lives in [place].  Hgb:    With Liver Specialist at [facility] Urine:  2+ WBC's, Mod. Ketones. No symptoms.  History of UTIs at a younger age.   GYNECOLOGIC HISTORY: Patient's last menstrual period was 02/24/2010. Sexually active:  yes Partner preference: female Contraception:  postmenopausal   Menopausal hormone therapy: none DES exposure: no   Blood transfusions:   no Sexually transmitted diseases:  no  GYN procedures and prior surgeries:  Laparoscopy, D & C Last mammogram:   10-12-12 wnl:[facility]--scheduled for today.              Last pap and high risk HPV testing:  10-12-12 wnl:neg HR HPV  History of abnormal pap smear:  no   OB History   Grav Para Term Preterm Abortions TAB SAB Ect Mult Living   2 2        2        LIFESTYLE: Exercise:   walking           OTHER HEALTH MAINTENANCE: Tetanus/TDap:  Patient thinks up to date HPV:                  n/a Influenza:           never   Bone density:    2015 at [facility]: osteopenia.  Was on bisphosphonate back in 2008.  Took for a couple of years and then stopped. History of hip fracture in 53 yo. Colonoscopy:   01/2013 at [place].  Next colonoscopy 01/2023.  Cholesterol check: wnl with PCP  Family History  Problem Relation Age of Onset  . Cancer Father     LUNG CANCER  . Heart attack Brother   . Heart disease Brother     Patient Active Problem List   Diagnosis Date Noted  . VERTIGO 03/08/2010  . VARICOSE VEINS LOWER EXTREMITIES W/OTH COMPS 06/29/2008  . LEG PAIN, BILATERAL 06/29/2008  . OTHER MALAISE  AND FATIGUE 06/23/2007  . UNSPECIFIED VITAMIN D DEFICIENCY 06/21/2007  . OSTEOPENIA 06/21/2007  . ARRHYTHMIA, HX OF 06/21/2007   Past Medical History  Diagnosis Date  . MVP (mitral valve prolapse)     req Prophylaxis  . Osteopenia     Hip Fracture Age   . Elevated liver enzymes 2013  . Fatty liver     Past Surgical History  Procedure Laterality Date  . Dilation and curettage of uterus    . Laparoscopy      D&C    ALLERGIES: Review of patient's allergies indicates no known allergies.  Current Outpatient Prescriptions  Medication Sig Dispense Refill  . Cholecalciferol (VITAMIN D3) 1000 UNITS CAPS Take by mouth 2 (two) times daily.      . Multiple Vitamin (MULTI-VITAMIN DAILY PO) Take by mouth.       No current facility-administered medications for this visit.     ROS:  Pertinent items are noted in HPI.  History   Social History  . Marital Status: Married    Spouse  Name: N/A    Number of Children: N/A  . Years of Education: N/A   Occupational History  . Not on file.   Social History Main Topics  . Smoking status: Never Smoker   . Smokeless tobacco: Never Used  . Alcohol Use: No  . Drug Use: No  . Sexual Activity: Yes    Partners: Male    Birth Control/ Protection: Post-menopausal   Other Topics Concern  . Not on file   Social History Narrative  . No narrative on file    PHYSICAL EXAMINATION:    BP 120/82  Pulse 80  Resp 16  Ht 5\' 1"  (1.549 m)  Wt 153 lb 9.6 oz (69.673 kg)  BMI 29.04 kg/m2  LMP 02/24/2010   Wt Readings from Last 3 Encounters:  10/14/13 153 lb 9.6 oz (69.673 kg)  10/12/12 170 lb (77.111 kg)  03/08/10 155 lb 8 oz (70.534 kg)     Ht Readings from Last 3 Encounters:  10/14/13 5\' 1"  (1.549 m)  10/12/12 5\' 1"  (1.549 m)  03/08/10 5' 1.25" (1.556 m)    General appearance: alert, cooperative and appears stated age Head: Normocephalic, without obvious abnormality, atraumatic Neck: no adenopathy, supple, symmetrical, trachea midline  and thyroid not enlarged, symmetric, no tenderness/mass/nodules Lungs: clear to auscultation bilaterally Breasts: Inspection negative, No nipple retraction or dimpling, No nipple discharge or bleeding, No axillary or supraclavicular adenopathy, Normal to palpation without dominant masses Heart: regular rate and rhythm Abdomen: soft, non-tender; no masses,  no organomegaly Extremities: extremities normal, atraumatic, no cyanosis or edema Skin: Skin color, texture, turgor normal. No rashes or lesions Lymph nodes: Cervical, supraclavicular, and axillary nodes normal. No abnormal inguinal nodes palpated Neurologic: Grossly normal  Pelvic: External genitalia:  no lesions              Urethra:  normal appearing urethra with no masses, tenderness or lesions              Bartholins and Skenes: normal                 Vagina: normal appearing vagina with normal color and discharge, no lesions              Cervix: normal appearance.  Small cervical polyp - consent for removal.  Removed portion with dressing forceps. Small portion remains and cannot remove easily.              Pap and high risk HPV testing done: No.        Bimanual Exam:  Uterus:  uterus is normal size, shape, consistency and nontender                                      Adnexa: normal adnexa in size, nontender and no masses                                      Rectovaginal:  Yes.                                        Confirms above.  Anus:  normal sphincter tone, no lesions  ASSESSMENT  Normal gynecologic exam. Cervical polyp removed in part.  Osteopenia.  Care through [facility] recommended yearly starting at age 43. Pap smear and high risk HPV testing as above. Counseled on self breast exam, Calcium and vitamin D intake, exercise. See lab orders: Yes.   Pathology for cervical polyp Return annually or prn   An After Visit Summary was printed and given to the  patient.     Marland Kitchen

## 2013-10-14 NOTE — Patient Instructions (Signed)

## 2013-10-15 LAB — URINALYSIS, MICROSCOPIC ONLY
CASTS: NONE SEEN
CRYSTALS: NONE SEEN

## 2013-10-16 LAB — URINE CULTURE

## 2013-10-18 LAB — IPS OTHER TISSUE BIOPSY

## 2013-10-21 ENCOUNTER — Telehealth: Payer: Self-pay

## 2013-10-21 ENCOUNTER — Ambulatory Visit: Payer: BC Managed Care – PPO

## 2013-10-21 NOTE — Telephone Encounter (Signed)
Spoke with patient at time of incoming call. Patient would like to reschedule [facility] appointment for today due to her work schedule. Advised patient of importance of repeating culture as there were a large amount of WBC's noted in her previous culture. Need to make see if culture was vaginally contaminated or if WBC are present in urine. Patient states that she is going out of town for work until next Tuesday. Agreeable to schedule for Tuesday at 9am. Patient will be seen for care if any symptoms develop such as fever, abdominal pain, back pain, urinary frequency, bladder pressure, and burning with urination. Patient agreeable.  Routing to provider for final review. Patient agreeable to disposition. Will close encounter

## 2013-10-25 ENCOUNTER — Ambulatory Visit (INDEPENDENT_AMBULATORY_CARE_PROVIDER_SITE_OTHER): Payer: BC Managed Care – PPO | Admitting: *Deleted

## 2013-10-25 VITALS — BP 126/80 | HR 88 | Temp 98.9°F | Resp 16

## 2013-10-25 DIAGNOSIS — R82998 Other abnormal findings in urine: Secondary | ICD-10-CM

## 2013-10-25 NOTE — Progress Notes (Signed)
Patient presents today for TOC. Patient denies any sx.

## 2013-10-26 ENCOUNTER — Telehealth: Payer: Self-pay

## 2013-10-26 LAB — URINALYSIS, MICROSCOPIC ONLY
BACTERIA UA: NONE SEEN
CASTS: NONE SEEN
Crystals: NONE SEEN

## 2013-10-26 NOTE — Telephone Encounter (Signed)
Encounter opened in error

## 2013-10-26 NOTE — Telephone Encounter (Signed)
Message copied by Jasmine Awe on Wed Oct 26, 2013  3:43 PM ------      Message from: [place], [place]: Wed Oct 26, 2013  1:54 PM       Please contact patient with results.      Her urine is again showing white blood cells, and this requires further evaluation.       I recommend the patient come in this week for a catheter specimen urine dip, micro, and culture.             Cc- Marisa Sprinkles ------

## 2013-10-26 NOTE — Telephone Encounter (Signed)
Spoke with patient. Results given as seen below. Patient would like to know more about why this needs to be done. Advised patient that we need to be able to further evaluate why her urine is showing WBC's. Need to make sure what is causing this and what needs to be done. Advised patient getting a catheter specimen is the best way to get the best results. Advised this reduces risk for contamination. Patient would like to know if she can drop her urine off at the office. Advised patient we need her to come in for this appointment. Offered appointment tomorrow with Dr.Silva but patient declines. Will be out of town until next Tuesday. Appointment offered for next Wednesday but patient declines due to work. Appointment scheduled for next Friday at 9:45am with Dr.Silva. Patient agreeable. Patient states that she had blood work done at [facility] and would like Dr.Silva to review these as well. Advised patient she can have records sent to our office for Dr.Silva's review. Patient agreeable.  Routing to provider for final review. Patient agreeable to disposition. Will close encounter

## 2013-10-27 ENCOUNTER — Telehealth: Payer: Self-pay

## 2013-10-27 NOTE — Telephone Encounter (Signed)
Call to patient to remind her of appointment scheduled for Friday 9/11 at 9:45am with Dr.Silva per patient's request yesterday. See telephone encounter. Patient is agreeable. Patient states that she had blood work done at [facility] and would like Dr.Silva to take a look at them in hopes that she will not have to come in for catheterization for urine culture. Advised while these lab results will be helpful for Dr.Silva to review an appointment will still be needed. Patient would like Dr.Silva to take a look at results and would like a call back. Patient would like to email results to me. Advised I am unable to do this as it is against HIPPA and it is to protect her health information. Advised she can fax lab results to our office at [phone]. Patient is agreeable.  Routing to provider for final review. Patient agreeable to disposition. Will close encounter

## 2013-11-04 ENCOUNTER — Telehealth: Payer: Self-pay | Admitting: Obstetrics and Gynecology

## 2013-11-04 ENCOUNTER — Ambulatory Visit: Payer: BC Managed Care – PPO | Admitting: Obstetrics and Gynecology

## 2013-11-04 NOTE — Telephone Encounter (Signed)
Reviewed with Dr Quincy Simmonds. Call to patient to review recommendation for cath urine specimen.  Patient expressed frustration regarding appointment today that she didn't feel she needed. Drives from [place]. Initially wanted to cancel but came in to avoid charge. Stuck in traffic due to local event having road closures. Arrived late and was told we might not be able to see her. Left in frustration. Apologized for circumstances that led to her experience today. Stressed need to schedule OV with Dr Quincy Simmonds. While on call with patient while Dr Quincy Simmonds attempting to call her.  Dr Quincy Simmonds joined my call for three way call on speaker phone. Dr Quincy Simmonds explained clinical reasons for cath UA and recommended follow-up. Patient agreeable to schedule but will be traveling next week and not retuning until 11-21-13. Appointment scheduled for 11-23-13. Again apologized for experience today.   Starla, please be sure no charge added today. Send service recovery card.  Routing to provider for final review. Patient agreeable to disposition. Will close encounter

## 2013-11-04 NOTE — Telephone Encounter (Signed)
Patient arrived 16 minutes late to her appointment with Dr. Quincy Simmonds for urine catheter with urine micro, dip, and culture but did not stay to give [place] a chance to see if we could still see her. No missed appointment fee charged. However, Ms. Selinger called back after she left our office requesting for Dr. Quincy Simmonds to call her. I asked the patient for more information to better route the call and the patient stated, "I felt pressured to come in for my appointment today. I was stuck in traffic and got there 16 minutes late and was told they were not sure they could still see me." The patient had called in earlier to cancel the appointment but was told there would be a $50 fee for the missed appointment. Please advise?  Cc: Gay Filler

## 2013-11-04 NOTE — Telephone Encounter (Signed)
Phone call returned to patient now.  No answer on cell phone.  Left message that I called and asked for [place] to try to reach each other again on Monday, September 21.  No details left.   On chart review, I see that Dr. Joan Flores asked for Dr. Matilde Sprang to do a consultation regarding a potential diverticulum of the urethral.  Patient had a cystoscopy which was normal.  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   There was a recommendation for voiding cystourethrogram if diverticula was still suspected.   I do recommend a catheter specimen urine for further explanation of the pyuria.  Her evaluation is not complete to date.  If she chooses to see [facility], I would support that as well.

## 2013-11-07 NOTE — Telephone Encounter (Signed)
No charge for missed appointment was entered.

## 2013-11-23 ENCOUNTER — Ambulatory Visit: Payer: BC Managed Care – PPO | Admitting: Obstetrics and Gynecology

## 2013-11-24 HISTORY — PX: OTHER SURGICAL HISTORY: SHX169

## 2013-11-24 HISTORY — PX: TOTAL ABDOMINAL HYSTERECTOMY: SHX209

## 2013-12-12 ENCOUNTER — Telehealth: Payer: Self-pay | Admitting: Obstetrics and Gynecology

## 2013-12-12 NOTE — Telephone Encounter (Signed)
Pt canceled Urine Micro for thrusday with dr Quincy Simmonds. Pt said she knew the results already so there was no need to redo the testing.

## 2013-12-13 NOTE — Telephone Encounter (Signed)
I called the patient about a records request she completed and she stated she no longer needs them sent at this time. She wants Dr. 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 Shawn Route at [facility].

## 2013-12-13 NOTE — Telephone Encounter (Signed)
Dr Quincy Simmonds has discussed the recommendation with the patient. Since she is out of the office today, lets wait until Dr Quincy Simmonds returns and we review with her, to contact patient.

## 2013-12-13 NOTE — Telephone Encounter (Signed)
Dr. Quincy Simmonds and Gay Filler,   It appears patient has discussed previously with both of you reasons for appointment.  Patient cancelled appointment.  How to handle follow up?

## 2013-12-14 NOTE — Telephone Encounter (Signed)
I am not concerned about follow up here for the patient at this time.  I am certain that she will receive excellent care for her small bowel cancer, and I wish her well.  Perhaps the WBCs on the urine were contamination related to her bowel function. Please have the patient just request a repeat urine check with her doctors giving her her cancer care. Thanks.

## 2013-12-15 ENCOUNTER — Ambulatory Visit: Payer: BC Managed Care – PPO | Admitting: Obstetrics and Gynecology

## 2013-12-16 NOTE — Telephone Encounter (Signed)
Message left to return call to [place] at [phone].  Messaged advised non urgent. No details left per designated party release form

## 2013-12-23 NOTE — Telephone Encounter (Signed)
Okay to close

## 2013-12-26 ENCOUNTER — Encounter: Payer: Self-pay | Admitting: Obstetrics and Gynecology

## 2014-05-31 ENCOUNTER — Telehealth: Payer: Self-pay | Admitting: Obstetrics and Gynecology

## 2014-05-31 NOTE — Telephone Encounter (Signed)
Patient is asking to talk with Dr.Silva'a nurse. No details given. I asked her if this was urgent, she said "no". Last seen 10/14/13.

## 2014-05-31 NOTE — Telephone Encounter (Signed)
Attempted to reach patient at number provided [phone]. Line is busy. Will try again later.

## 2014-06-01 NOTE — Telephone Encounter (Signed)
Operative report and pathology report available through [facility] dated 12/16/2013. Patient had this performed at [facility].

## 2014-06-01 NOTE — Telephone Encounter (Signed)
Spoke with patient. Patient states "I had a total hysterectomy in October. Do I need to come in for an annual?" Advised patient will still need to be seen yearly for annual for pelvic exam, breast check, lab work if needed, and refills on medication if needed. Patient is agreeable and verbalizes understanding. Aex scheduled for 10/25/2014 with Dr.Silva.  Routing to provider for final review. Patient agreeable to disposition. Will close encounter

## 2014-06-01 NOTE — Telephone Encounter (Signed)
Please get reports from hysterectomy - op report and pathology report.  I do not see this in the [facility].

## 2014-10-25 ENCOUNTER — Ambulatory Visit: Payer: BC Managed Care – PPO | Admitting: Obstetrics and Gynecology

## 2014-11-23 ENCOUNTER — Encounter: Payer: Self-pay | Admitting: Obstetrics and Gynecology

## 2014-11-23 ENCOUNTER — Ambulatory Visit (INDEPENDENT_AMBULATORY_CARE_PROVIDER_SITE_OTHER): Payer: BLUE CROSS/BLUE SHIELD | Admitting: Obstetrics and Gynecology

## 2014-11-23 VITALS — BP 120/80 | HR 84 | Resp 16 | Ht 60.25 in | Wt 159.0 lb

## 2014-11-23 DIAGNOSIS — Z01419 Encounter for gynecological examination (general) (routine) without abnormal findings: Secondary | ICD-10-CM | POA: Diagnosis not present

## 2014-11-23 DIAGNOSIS — Z0000 Encounter for general adult medical examination without abnormal findings: Secondary | ICD-10-CM | POA: Diagnosis not present

## 2014-11-23 NOTE — Progress Notes (Signed)
54 y.o. G2P2 Married Caucasian female here for annual exam.    Has high grade B cell lymphoma. Had a bowel resection and hysterectomy with removal of tubes and ovaries in December 16, 2013 at [facility].  Had disease which was adherent to uterus and ovaries.  Did chemotherapy at [facility] as well.  IV and intrathecal in brain.  Just finished this in August.  No recurrence of disease.  Back to work part time. Part time in [place], part time in [place]. Will join a new firm.   PCP: Hortencia Pilar     Patient's last menstrual period was 02/24/2010.          Sexually active: No.  The current method of family planning is post menopausal status.    Exercising: No.  The patient does not participate in regular exercise at present.  Walking.  Smoker:  no  Health Maintenance: Pap:  10/12/12 Neg. HR HPV:neg History of abnormal Pap:  no MMG:  10/17/13 BIRADS1:neg.  Will skip this year. Colonoscopy:  01/2013. Repeat in 10 years  BMD:   2015?  Result  Osteopenia. TDaP:  2016 Screening Labs:  Hb today: Oncology, Urine today:    reports that she has never smoked. She has never used smokeless tobacco. She reports that she does not drink alcohol or use illicit drugs.  Past Medical History  Diagnosis Date  . MVP (mitral valve prolapse)     req Prophylaxis  . Osteopenia     Hip Fracture Age   . Elevated liver enzymes 2013  . Fatty liver   . High grade B-cell lymphoma     12/2013   . Chemotherapy induced nausea and vomiting     01/2014  . Status post chemotherapy     09/2014  . 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 Adnexal mass     11/2013  . Ovarian mass     11/2013  . 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 Dilation and curettage of uterus    . Laparoscopy      D&C  . Bowel resection  09/2013    Small  . Colectomy partial coloproctostomy  11/2013  . Colonoscopy w/ biopsies  01/2013  . Proctosigmoidoscopy rigid  11/2013  . Total abdominal  hysterectomy  11/2013    bilateral salpingo-oophorectomy    Current Outpatient Prescriptions  Medication Sig Dispense Refill  . LORazepam (ATIVAN) 1 MG tablet Take 1 tablet by mouth as needed.    Marland Kitchen OLANZapine (ZYPREXA) 10 MG tablet Take 1 tablet by mouth as needed.    . ondansetron (ZOFRAN) 8 MG tablet Take by mouth as needed.    Marland Kitchen oxycodone (OXY-IR) 5 MG capsule Take by mouth as needed.    . prochlorperazine (COMPAZINE) 10 MG tablet Take by mouth as needed.     No current facility-administered medications for this visit.    Family History  Problem Relation Age of Onset  . Cancer Father     LUNG CANCER  . Heart attack Brother   . Heart disease Brother     ROS:  Pertinent items are noted in HPI.  Otherwise, a comprehensive ROS was negative.  Exam:   BP 120/80 mmHg  Pulse 84  Resp 16  Ht 5' 0.25" (1.53 m)  Wt 159 lb (72.122 kg)  BMI 30.81 kg/m2  LMP 02/24/2010    General appearance: alert, cooperative and appears stated age  Head: Normocephalic, without obvious abnormality, atraumatic Neck: no adenopathy, supple, symmetrical, trachea midline and thyroid normal to inspection and palpation Lungs: clear to auscultation bilaterally Breasts: normal appearance, no masses or tenderness, Inspection negative, No nipple retraction or dimpling, No nipple discharge or bleeding, No axillary or supraclavicular adenopathy Heart: regular rate and rhythm Abdomen: umbilical hernia, reducible, soft, non-tender; bowel sounds normal; no masses,  no organomegaly Extremities: extremities normal, atraumatic, no cyanosis or edema Skin: Skin color, texture, turgor normal. No rashes or lesions Lymph nodes: Cervical, supraclavicular, and axillary nodes normal. No abnormal inguinal nodes palpated Neurologic: Grossly normal  Pelvic: External genitalia:  no lesions              Urethra:  normal appearing urethra with no masses, tenderness or lesions              Bartholins and Skenes: normal                  Vagina: normal appearing vagina with normal color and discharge, no lesions              Cervix: absent              Pap taken: Yes.   Bimanual Exam:  Uterus:  uterus absent              Adnexa: normal adnexa and no mass, fullness, tenderness              Rectovaginal: Yes.  .  Confirms.              Anus:  normal sphincter tone, no lesions  Chaperone was present for exam.  Assessment:   Well woman visit with normal exam. Status post TAH/BSO.  B cell lymphoma. Umbilical hernia.   Plan: Yearly mammogram recommended after age 31.   Patient will consider. Recommended self breast exam.  Pap and HR HPV as above. Discussed Calcium, Vitamin D, regular exercise program including cardiovascular and weight bearing exercise. Labs performed.  No..     Refills given on medications.  No..    Follow up annually and prn.   After visit summary provided.

## 2014-11-23 NOTE — Patient Instructions (Signed)

## 2014-11-27 LAB — IPS PAP TEST WITH HPV

## 2014-12-08 ENCOUNTER — Other Ambulatory Visit: Payer: Self-pay

## 2014-12-08 DIAGNOSIS — Z1231 Encounter for screening mammogram for malignant neoplasm of breast: Secondary | ICD-10-CM

## 2014-12-20 ENCOUNTER — Ambulatory Visit
Admission: RE | Admit: 2014-12-20 | Discharge: 2014-12-20 | Disposition: A | Discharge status: Home / Self Care | Payer: BLUE CROSS/BLUE SHIELD | Source: Ambulatory Visit

## 2014-12-20 DIAGNOSIS — Z1231 Encounter for screening mammogram for malignant neoplasm of breast: Secondary | ICD-10-CM

## 2015-12-12 ENCOUNTER — Encounter: Payer: Self-pay | Admitting: Obstetrics and Gynecology

## 2015-12-12 ENCOUNTER — Ambulatory Visit (INDEPENDENT_AMBULATORY_CARE_PROVIDER_SITE_OTHER): Payer: Managed Care, Other (non HMO) | Admitting: Obstetrics and Gynecology

## 2015-12-12 VITALS — BP 122/76 | HR 70 | Resp 16 | Ht 60.5 in | Wt 178.6 lb

## 2015-12-12 DIAGNOSIS — Z0000 Encounter for general adult medical examination without abnormal findings: Secondary | ICD-10-CM

## 2015-12-12 DIAGNOSIS — Z01419 Encounter for gynecological examination (general) (routine) without abnormal findings: Secondary | ICD-10-CM

## 2015-12-12 LAB — POCT URINALYSIS DIPSTICK
BILIRUBIN UA: NEGATIVE
Glucose, UA: NEGATIVE
KETONES UA: NEGATIVE
Leukocytes, UA: NEGATIVE
Nitrite, UA: NEGATIVE
PH UA: 5
PROTEIN UA: NEGATIVE
RBC UA: NEGATIVE
Urobilinogen, UA: NEGATIVE

## 2015-12-12 NOTE — Progress Notes (Signed)
55 y.o. G2P2 Married Caucasian female here for annual exam.    Hx high grade B cell lymphoma. Had a bowel resection and hysterectomy with removal of tubes and ovaries in December 16, 2013 at [facility].  Had disease which was adherent to uterus and ovaries.  Did chemotherapy at [facility] as well.  IV and intrathecal in brain.  Treatment complete.  Not on any medication.  Does follow up at [facility].   No real exercise routine.  Works as a Engineer, maintenance (IT).  Working too hard.  Going to [place], [place] to work remotely for about 5 weeks. Family is there.  PCP:   Hortencia Pilar - [facility] in [place].  Patient's last menstrual period was 02/24/2010.           Sexually active: Yes.   female The current method of family planning is post menopausal status and TAH/BSO.    Exercising: No.   Smoker:  no  Health Maintenance: Pap: 11-23-14 Neg:Neg HR HPV  History of abnormal Pap:  no MMG:  12-20-14 Density C/Neg:BiRads1:[facility] Colonoscopy:  01/2013 normal at [facility];next due 01/2023 BMD:   2015  Result  Osteopenia at [facility].   TDaP:  09-20-14 Gardasil:   N/A HIV:  Will do Oncology in December. Hep C:  Will do with Oncology in December. Screening Labs:  Hb today: with Oncology, Urine today: neg.   reports that she has never smoked. She has never used smokeless tobacco. She reports that she drinks alcohol. She reports that she does not use drugs.  Past Medical History:  Diagnosis Date  . Adnexal mass    11/2013  . Chemotherapy induced nausea and vomiting    01/2014  . Elevated liver enzymes 2013  . Fatty liver   . High grade B-cell lymphoma ([place])    12/2013   . 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 MVP (mitral valve prolapse)    req Prophylaxis  . Osteopenia    Hip Fracture Age   . Ovarian mass    11/2013  . Small bowel cancer ([place])    11/2013  . Status post chemotherapy    09/2014    Past Surgical History:  Procedure Laterality Date  . BOWEL RESECTION  09/2013   Small  .  Colectomy Partial Coloproctostomy  11/2013  . COLONOSCOPY W/ BIOPSIES  01/2013  . DILATION AND CURETTAGE OF UTERUS    . LAPAROSCOPY     D&C  . Proctosigmoidoscopy Rigid  11/2013  . TOTAL ABDOMINAL HYSTERECTOMY  11/2013   bilateral salpingo-oophorectomy    No current outpatient prescriptions on file.   No current facility-administered medications for this visit.     Family History  Problem Relation Age of Onset  . Cancer Father     LUNG CANCER  . Heart attack Brother   . Heart disease Brother     ROS:  Pertinent items are noted in HPI.  Otherwise, a comprehensive ROS was negative.  Exam:   BP 122/76 (BP Location: Right Arm, Patient Position: Sitting, Cuff Size: Large)   Pulse 70   Resp 16   Ht 5' 0.5" (1.537 m)   Wt 178 lb 9.6 oz (81 kg)   LMP 02/24/2010   BMI 34.31 kg/m     General appearance: alert, cooperative and appears stated age Head: Normocephalic, without obvious abnormality, atraumatic Neck: no adenopathy, supple, symmetrical, trachea midline and thyroid normal to inspection and palpation Lungs: clear to auscultation bilaterally Breasts: normal appearance, no masses or  tenderness, No nipple retraction or dimpling, No nipple discharge or bleeding, No axillary or supraclavicular adenopathy Heart: regular rate and rhythm Abdomen: vertical midline incision with umbilical hernia, soft, non-tender; no masses, no organomegaly Extremities: extremities normal, atraumatic, no cyanosis or edema Skin: Skin color, texture, turgor normal. No rashes or lesions Lymph nodes: Cervical, supraclavicular, and axillary nodes normal. No abnormal inguinal nodes palpated Neurologic: Grossly normal  Pelvic: External genitalia:  no lesions              Urethra:  normal appearing urethra with no masses, tenderness or lesions              Bartholins and Skenes: normal                 Vagina: normal appearing vagina with normal color and discharge, no lesions              Cervix:   absent              Pap taken: No. Bimanual Exam:  Uterus:   Absent.              Adnexa: no mass, fullness, tenderness              Rectal exam: Yes.  .  Confirms.              Anus:  normal sphincter tone, no lesions  Chaperone was present for exam.  Assessment:   Well woman visit with normal exam. Status post TAH/BSO.  Had adhesions when she had surgery done for bowel resection. B cell lymphoma. Umbilical hernia.  Osteopenia.   Plan: Yearly mammogram recommended after age 87.  She will schedule this.  Recommended self breast exam.  Pap and HR HPV as above. Discussed Calcium, Vitamin D, regular exercise program including cardiovascular and weight bearing exercise. She will do her BMD at [facility]  HIV and hep C with Oncology.  We talked about limit setting so she can work less and enjoy life more.   Follow up annually and prn.      After visit summary provided.

## 2015-12-12 NOTE — Patient Instructions (Signed)

## 2016-01-28 ENCOUNTER — Other Ambulatory Visit: Payer: Self-pay | Admitting: Family Medicine

## 2016-01-28 DIAGNOSIS — Z1231 Encounter for screening mammogram for malignant neoplasm of breast: Secondary | ICD-10-CM

## 2016-02-19 ENCOUNTER — Ambulatory Visit
Admission: RE | Admit: 2016-02-19 | Discharge: 2016-02-19 | Disposition: A | Discharge status: Home / Self Care | Payer: Managed Care, Other (non HMO) | Source: Ambulatory Visit | Attending: Family Medicine | Admitting: Family Medicine

## 2016-02-19 ENCOUNTER — Other Ambulatory Visit: Payer: Self-pay | Admitting: Family Medicine

## 2016-02-19 DIAGNOSIS — Z1231 Encounter for screening mammogram for malignant neoplasm of breast: Secondary | ICD-10-CM

## 2016-12-17 ENCOUNTER — Ambulatory Visit: Payer: Managed Care, Other (non HMO) | Admitting: Obstetrics and Gynecology

## 2017-10-16 ENCOUNTER — Other Ambulatory Visit: Payer: Self-pay | Admitting: Family Medicine

## 2017-10-16 DIAGNOSIS — Z1231 Encounter for screening mammogram for malignant neoplasm of breast: Secondary | ICD-10-CM

## 2017-11-09 ENCOUNTER — Ambulatory Visit
Admission: RE | Admit: 2017-11-09 | Discharge: 2017-11-09 | Disposition: A | Discharge status: Home / Self Care | Payer: 59 | Source: Ambulatory Visit | Attending: Family Medicine | Admitting: Family Medicine

## 2017-11-09 DIAGNOSIS — Z1231 Encounter for screening mammogram for malignant neoplasm of breast: Secondary | ICD-10-CM

## 2018-12-09 ENCOUNTER — Other Ambulatory Visit: Payer: Self-pay | Admitting: Family Medicine

## 2018-12-09 DIAGNOSIS — Z1231 Encounter for screening mammogram for malignant neoplasm of breast: Secondary | ICD-10-CM

## 2018-12-13 ENCOUNTER — Ambulatory Visit
Admission: RE | Admit: 2018-12-13 | Discharge: 2018-12-13 | Disposition: A | Discharge status: Home / Self Care | Payer: 59 | Source: Ambulatory Visit | Attending: Family Medicine | Admitting: Family Medicine

## 2018-12-13 ENCOUNTER — Other Ambulatory Visit: Payer: Self-pay

## 2018-12-13 DIAGNOSIS — Z1231 Encounter for screening mammogram for malignant neoplasm of breast: Secondary | ICD-10-CM

## 2019-04-17 IMAGING — MG DIGITAL SCREENING BILATERAL MAMMOGRAM WITH TOMO AND CAD
8 series · 9 of 24 positions shown · non-contrast
Comparison: Previous exam(s).

CLINICAL DATA: Screening.

EXAM:
DIGITAL SCREENING BILATERAL MAMMOGRAM WITH TOMO AND CAD

[L MLO synth-2D]
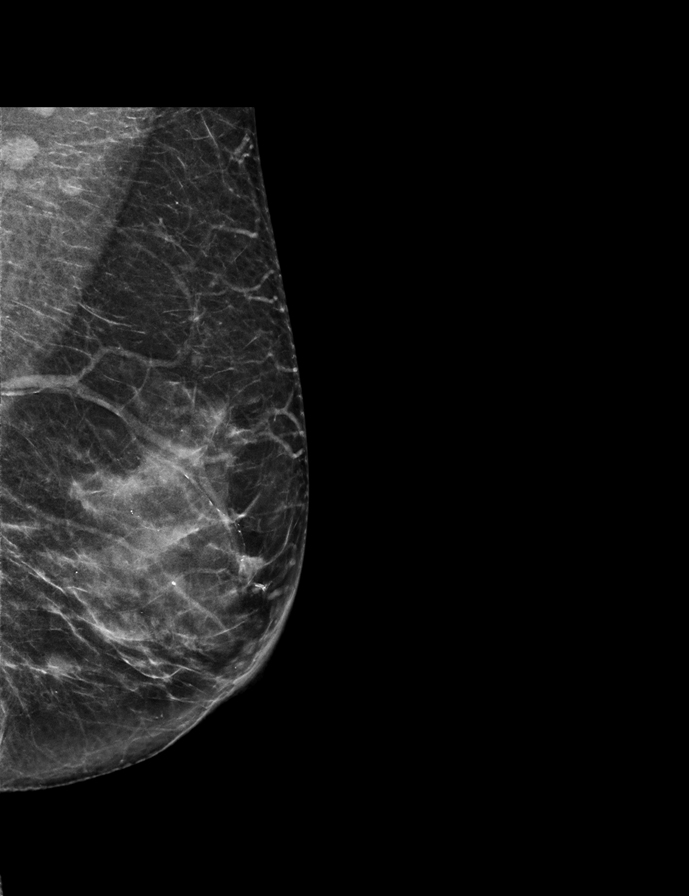

[L CC synth-2D]
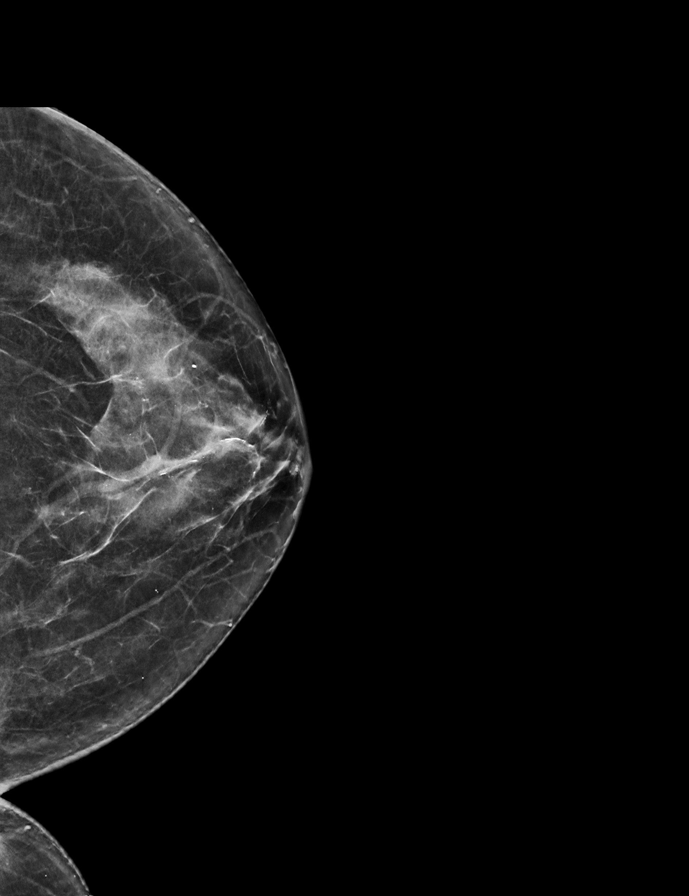

[R MLO synth-2D]
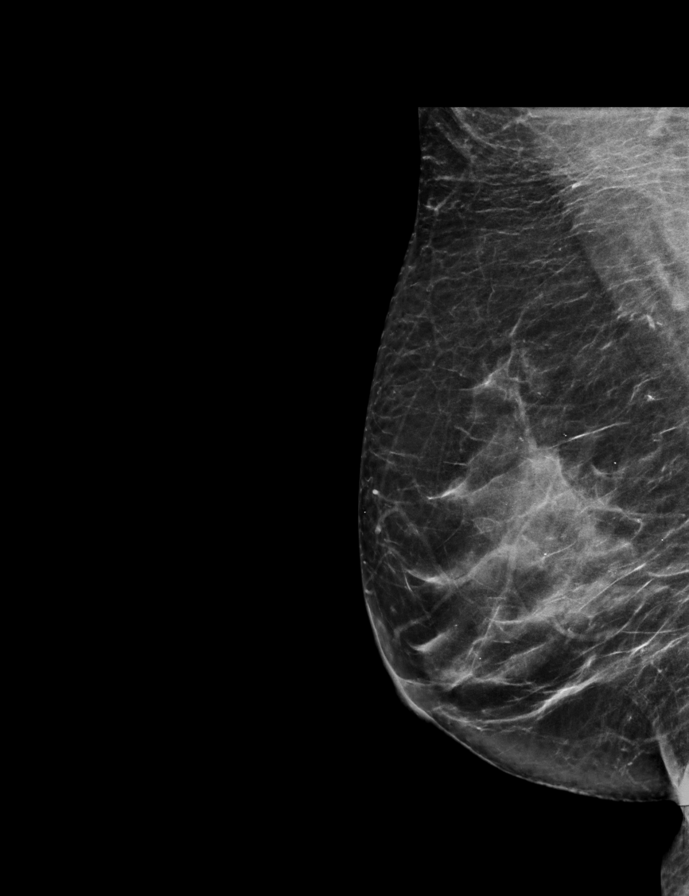

[R CC synth-2D]
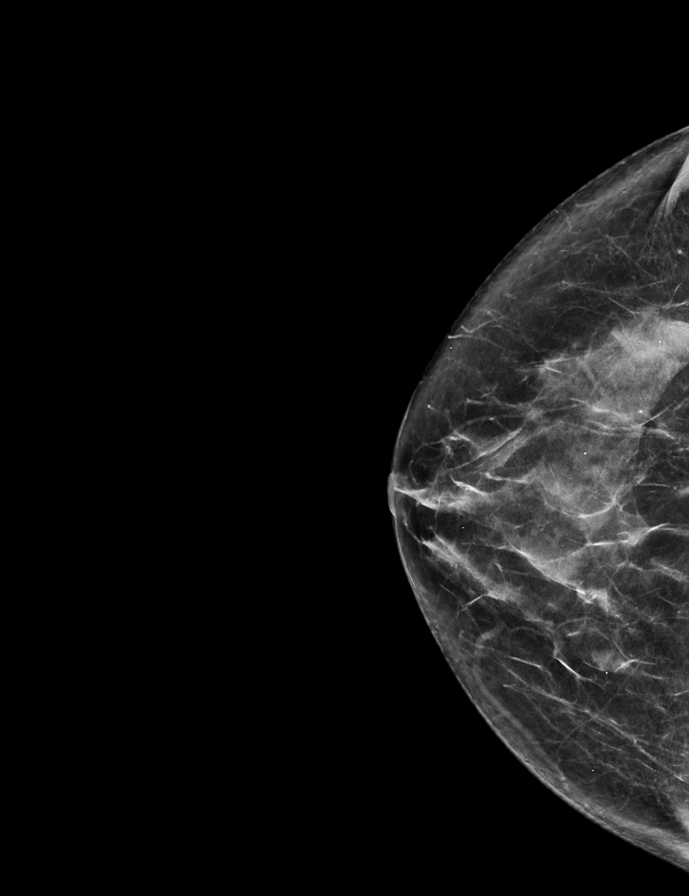

[R CC tomo · 2 of 61 frames shown]
[frame 20/61]
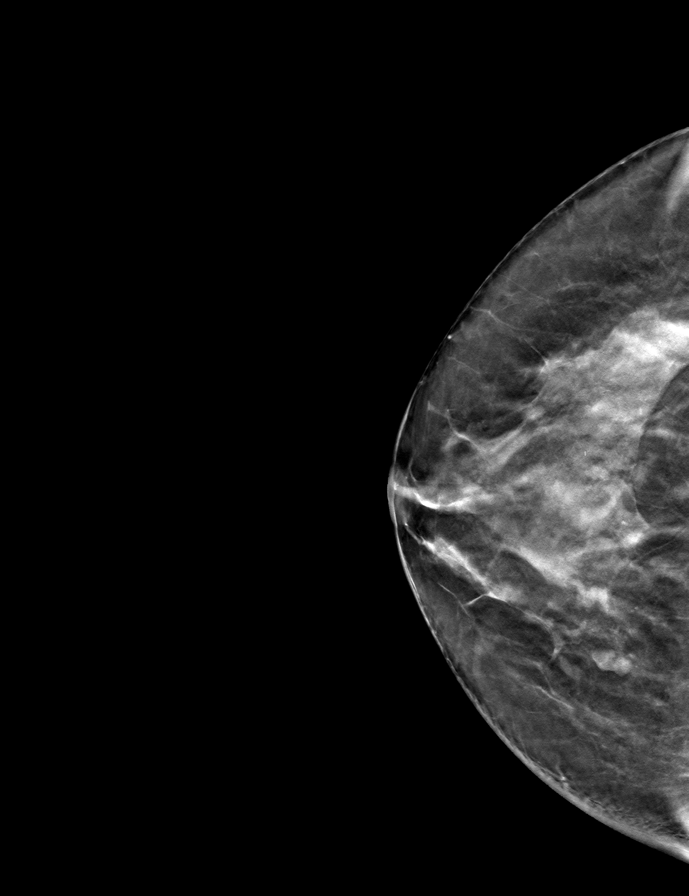
[frame 31/61]
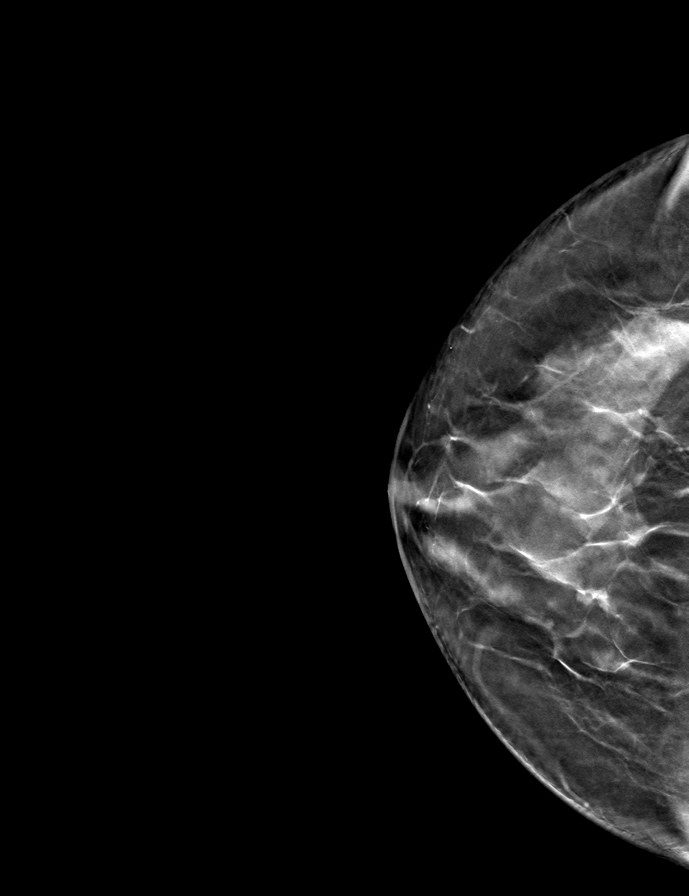

[L MLO tomo · tomo slice 32/63.0]
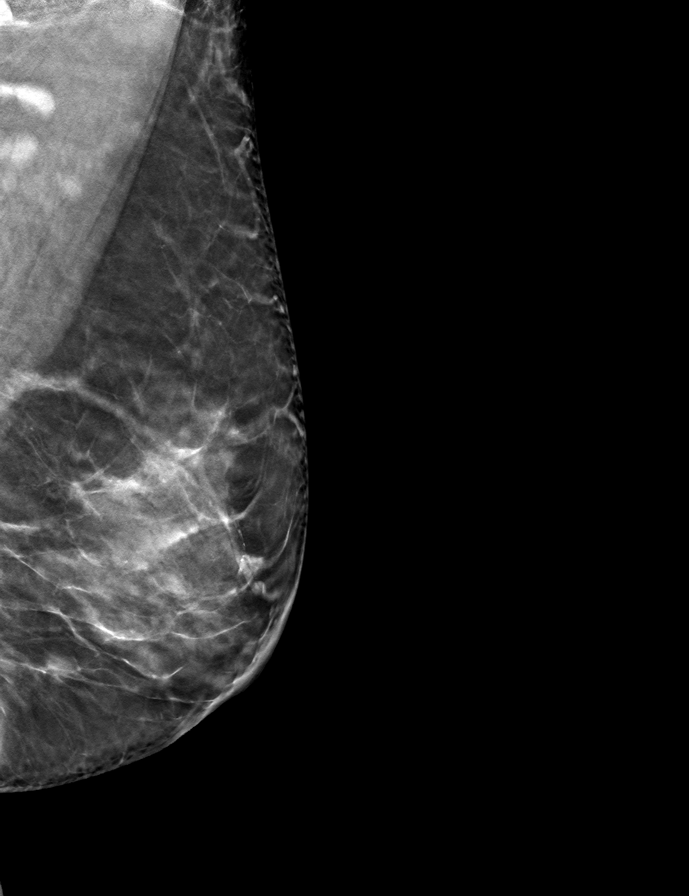

[L CC tomo · tomo slice 31/60.0]
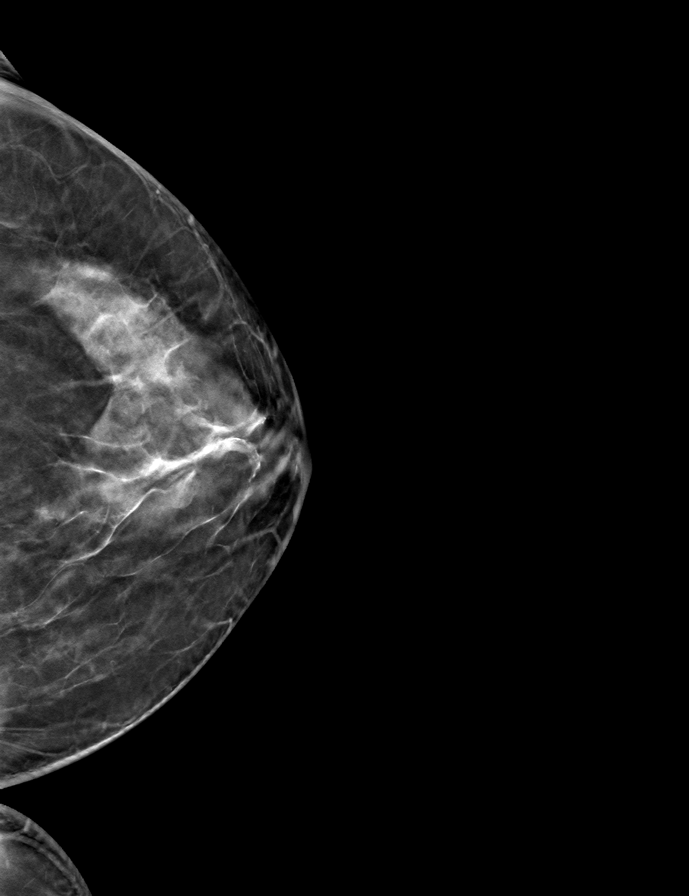

[R MLO tomo · tomo slice 35/68.0]
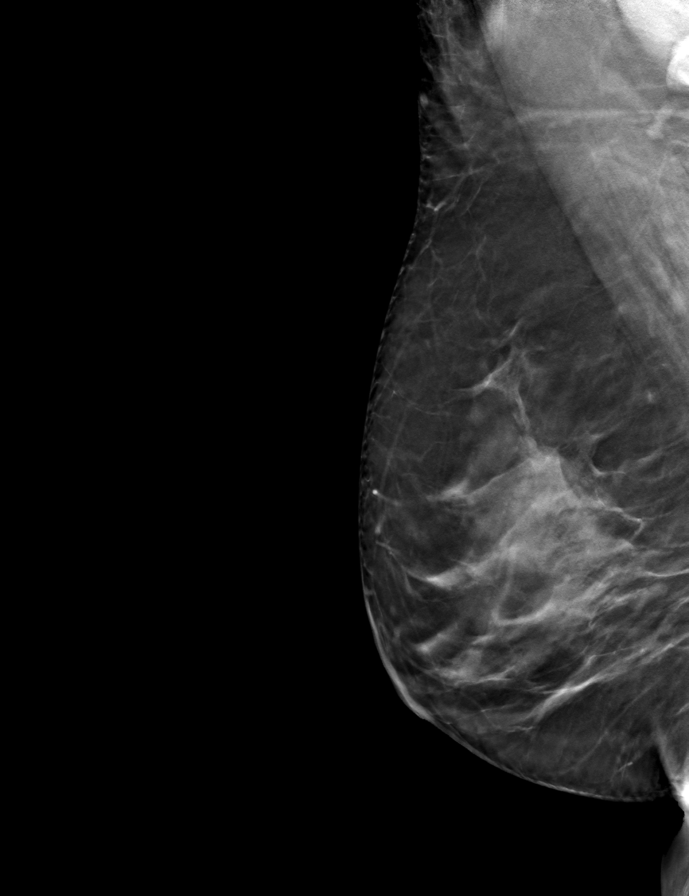

[9 of 24 positions shown; findings below may reference images not displayed]

ACR Breast Density Category c: The breast tissue is heterogeneously
dense, which may obscure small masses.
FINDINGS: There are no findings suspicious for malignancy. Images were
processed with CAD.
IMPRESSION: No mammographic evidence of malignancy. A result letter of this
screening mammogram will be mailed directly to the patient.

RECOMMENDATION:
Screening mammogram in one year. (Code:[id number]-LHB)

BI-RADS CATEGORY  1: Negative.

## 2019-05-13 ENCOUNTER — Ambulatory Visit: Payer: 59 | Attending: Internal Medicine

## 2019-05-13 DIAGNOSIS — Z23 Encounter for immunization: Secondary | ICD-10-CM

## 2019-05-13 NOTE — Progress Notes (Signed)
   Covid-19 Vaccination Clinic  Name:  Peggy Mcfarland    MRN: [id number] DOB: [birth year]  05/13/2019  [name] was observed post Covid-19 immunization for 15 minutes without incident. She was provided with Vaccine Information Sheet and instruction to access the V-Safe system.   [name] was instructed to call 911 with any severe reactions post vaccine: Marland Kitchen Difficulty breathing  . Swelling of face and throat  . A fast heartbeat  . A bad rash all over body  . Dizziness and weakness   Immunizations Administered    Name Date Dose VIS Date Route   Pfizer COVID-19 Vaccine 05/13/2019  8:45 AM 0.3 mL 02/04/2019 Intramuscular   Manufacturer: [place]   Lot: [id number]   [place]: [id number]

## 2019-06-08 ENCOUNTER — Ambulatory Visit: Payer: 59 | Attending: Internal Medicine

## 2019-06-08 DIAGNOSIS — Z23 Encounter for immunization: Secondary | ICD-10-CM

## 2019-06-08 NOTE — Progress Notes (Signed)
   Covid-19 Vaccination Clinic  Name:  Peggy Mcfarland    MRN: [id number] DOB: 09/28/1960  06/08/2019  [name] was observed post Covid-19 immunization for 15 minutes without incident. She was provided with Vaccine Information Sheet and instruction to access the V-Safe system.   [name] was instructed to call 911 with any severe reactions post vaccine: Marland Kitchen Difficulty breathing  . Swelling of face and throat  . A fast heartbeat  . A bad rash all over body  . Dizziness and weakness   Immunizations Administered    Name Date Dose VIS Date Route   Pfizer COVID-19 Vaccine 06/08/2019  8:20 AM 0.3 mL 02/04/2019 Intramuscular   Manufacturer: [place]   Lot: [id number]   [place]: [id number]

## 2020-11-23 ENCOUNTER — Other Ambulatory Visit: Payer: Self-pay | Admitting: Family Medicine

## 2020-11-23 DIAGNOSIS — Z1231 Encounter for screening mammogram for malignant neoplasm of breast: Secondary | ICD-10-CM

## 2020-12-20 ENCOUNTER — Ambulatory Visit: Payer: 59

## 2020-12-21 ENCOUNTER — Ambulatory Visit: Payer: 59

## 2021-06-27 ENCOUNTER — Ambulatory Visit: Payer: Self-pay

## 2021-06-27 ENCOUNTER — Ambulatory Visit
Admission: RE | Admit: 2021-06-27 | Discharge: 2021-06-27 | Disposition: A | Discharge status: Home / Self Care | Payer: 59 | Source: Ambulatory Visit | Attending: Family Medicine | Admitting: Family Medicine

## 2021-06-27 ENCOUNTER — Ambulatory Visit: Payer: 59

## 2021-06-27 DIAGNOSIS — Z1231 Encounter for screening mammogram for malignant neoplasm of breast: Secondary | ICD-10-CM
